# Patient Record
Sex: Female | Born: 1980 | Race: Black or African American | Hispanic: No | Marital: Single | Smoking: Current every day smoker
Health system: Southern US, Community
[De-identification: ages and names within clinical notes are randomized; demographics above are authoritative.]

## PROBLEM LIST (undated history)

## (undated) DIAGNOSIS — R413 Other amnesia: Secondary | ICD-10-CM

## (undated) DIAGNOSIS — N730 Acute parametritis and pelvic cellulitis: Secondary | ICD-10-CM

## (undated) DIAGNOSIS — A599 Trichomoniasis, unspecified: Secondary | ICD-10-CM

## (undated) DIAGNOSIS — B9689 Other specified bacterial agents as the cause of diseases classified elsewhere: Secondary | ICD-10-CM

## (undated) DIAGNOSIS — F419 Anxiety disorder, unspecified: Secondary | ICD-10-CM

## (undated) DIAGNOSIS — N760 Acute vaginitis: Secondary | ICD-10-CM

## (undated) DIAGNOSIS — F319 Bipolar disorder, unspecified: Secondary | ICD-10-CM

## (undated) DIAGNOSIS — R569 Unspecified convulsions: Secondary | ICD-10-CM

## (undated) DIAGNOSIS — E119 Type 2 diabetes mellitus without complications: Secondary | ICD-10-CM

## (undated) HISTORY — PX: WISDOM TOOTH EXTRACTION: SHX21

---
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
## 2000-10-10 ENCOUNTER — Encounter: Payer: Self-pay | Admitting: Emergency Medicine

## 2000-10-10 ENCOUNTER — Emergency Department (HOSPITAL_COMMUNITY): Admission: EM | Admit: 2000-10-10 | Discharge: 2000-10-10 | Payer: Self-pay | Admitting: Emergency Medicine

## 2001-07-01 ENCOUNTER — Inpatient Hospital Stay (HOSPITAL_COMMUNITY): Admission: AD | Admit: 2001-07-01 | Discharge: 2001-07-01 | Payer: Self-pay | Admitting: Obstetrics

## 2001-08-03 ENCOUNTER — Inpatient Hospital Stay (HOSPITAL_COMMUNITY): Admission: AD | Admit: 2001-08-03 | Discharge: 2001-08-06 | Payer: Self-pay | Admitting: Obstetrics

## 2001-10-23 ENCOUNTER — Emergency Department (HOSPITAL_COMMUNITY): Admission: EM | Admit: 2001-10-23 | Discharge: 2001-10-23 | Payer: Self-pay | Admitting: Emergency Medicine

## 2001-12-28 ENCOUNTER — Emergency Department (HOSPITAL_COMMUNITY): Admission: EM | Admit: 2001-12-28 | Discharge: 2001-12-28 | Payer: Self-pay | Admitting: Emergency Medicine

## 2002-09-23 ENCOUNTER — Inpatient Hospital Stay (HOSPITAL_COMMUNITY): Admission: AD | Admit: 2002-09-23 | Discharge: 2002-09-23 | Payer: Self-pay | Admitting: Family Medicine

## 2002-10-17 ENCOUNTER — Emergency Department (HOSPITAL_COMMUNITY): Admission: EM | Admit: 2002-10-17 | Discharge: 2002-10-17 | Payer: Self-pay | Admitting: Emergency Medicine

## 2002-10-17 ENCOUNTER — Encounter: Payer: Self-pay | Admitting: Emergency Medicine

## 2002-10-29 ENCOUNTER — Encounter: Payer: Self-pay | Admitting: *Deleted

## 2002-10-29 ENCOUNTER — Emergency Department (HOSPITAL_COMMUNITY): Admission: EM | Admit: 2002-10-29 | Discharge: 2002-10-29 | Payer: Self-pay | Admitting: *Deleted

## 2002-11-05 ENCOUNTER — Emergency Department (HOSPITAL_COMMUNITY): Admission: EM | Admit: 2002-11-05 | Discharge: 2002-11-05 | Payer: Self-pay | Admitting: Emergency Medicine

## 2002-12-09 ENCOUNTER — Emergency Department (HOSPITAL_COMMUNITY): Admission: EM | Admit: 2002-12-09 | Discharge: 2002-12-09 | Payer: Self-pay

## 2003-02-28 ENCOUNTER — Encounter: Admission: RE | Admit: 2003-02-28 | Discharge: 2003-02-28 | Payer: Self-pay | Admitting: Internal Medicine

## 2003-04-20 ENCOUNTER — Emergency Department (HOSPITAL_COMMUNITY): Admission: EM | Admit: 2003-04-20 | Discharge: 2003-04-20 | Payer: Self-pay | Admitting: Emergency Medicine

## 2003-05-11 ENCOUNTER — Inpatient Hospital Stay (HOSPITAL_COMMUNITY): Admission: AD | Admit: 2003-05-11 | Discharge: 2003-05-11 | Payer: Self-pay | Admitting: *Deleted

## 2003-06-14 ENCOUNTER — Inpatient Hospital Stay (HOSPITAL_COMMUNITY): Admission: AD | Admit: 2003-06-14 | Discharge: 2003-06-14 | Payer: Self-pay | Admitting: Obstetrics & Gynecology

## 2003-07-22 ENCOUNTER — Emergency Department (HOSPITAL_COMMUNITY): Admission: EM | Admit: 2003-07-22 | Discharge: 2003-07-23 | Payer: Self-pay | Admitting: Emergency Medicine

## 2003-08-23 ENCOUNTER — Inpatient Hospital Stay (HOSPITAL_COMMUNITY): Admission: AD | Admit: 2003-08-23 | Discharge: 2003-08-23 | Payer: Self-pay | Admitting: *Deleted

## 2003-12-15 ENCOUNTER — Inpatient Hospital Stay (HOSPITAL_COMMUNITY): Admission: AD | Admit: 2003-12-15 | Discharge: 2003-12-15 | Payer: Self-pay | Admitting: *Deleted

## 2003-12-18 ENCOUNTER — Inpatient Hospital Stay (HOSPITAL_COMMUNITY): Admission: AD | Admit: 2003-12-18 | Discharge: 2003-12-18 | Payer: Self-pay | Admitting: Gynecology

## 2004-02-24 ENCOUNTER — Inpatient Hospital Stay (HOSPITAL_COMMUNITY): Admission: AD | Admit: 2004-02-24 | Discharge: 2004-02-24 | Payer: Self-pay | Admitting: Obstetrics and Gynecology

## 2004-06-06 ENCOUNTER — Other Ambulatory Visit: Admission: RE | Admit: 2004-06-06 | Discharge: 2004-06-06 | Payer: Self-pay | Admitting: Gynecology

## 2004-09-25 ENCOUNTER — Inpatient Hospital Stay (HOSPITAL_COMMUNITY): Admission: AD | Admit: 2004-09-25 | Discharge: 2004-09-25 | Payer: Self-pay | Admitting: Gynecology

## 2004-10-03 ENCOUNTER — Inpatient Hospital Stay (HOSPITAL_COMMUNITY): Admission: AD | Admit: 2004-10-03 | Discharge: 2004-10-03 | Payer: Self-pay | Admitting: Obstetrics & Gynecology

## 2004-11-17 ENCOUNTER — Encounter: Payer: Self-pay | Admitting: Emergency Medicine

## 2004-11-17 ENCOUNTER — Inpatient Hospital Stay (HOSPITAL_COMMUNITY): Admission: AD | Admit: 2004-11-17 | Discharge: 2004-11-17 | Payer: Self-pay | Admitting: Obstetrics and Gynecology

## 2005-11-18 ENCOUNTER — Inpatient Hospital Stay (HOSPITAL_COMMUNITY): Admission: AD | Admit: 2005-11-18 | Discharge: 2005-11-18 | Payer: Self-pay | Admitting: Obstetrics

## 2005-12-31 ENCOUNTER — Inpatient Hospital Stay (HOSPITAL_COMMUNITY): Admission: AD | Admit: 2005-12-31 | Discharge: 2005-12-31 | Payer: Self-pay | Admitting: Obstetrics

## 2006-01-01 ENCOUNTER — Inpatient Hospital Stay (HOSPITAL_COMMUNITY): Admission: AD | Admit: 2006-01-01 | Discharge: 2006-01-01 | Payer: Self-pay | Admitting: Obstetrics

## 2006-01-09 ENCOUNTER — Inpatient Hospital Stay (HOSPITAL_COMMUNITY): Admission: AD | Admit: 2006-01-09 | Discharge: 2006-01-11 | Payer: Self-pay | Admitting: Obstetrics

## 2006-04-07 ENCOUNTER — Inpatient Hospital Stay (HOSPITAL_COMMUNITY): Admission: AD | Admit: 2006-04-07 | Discharge: 2006-04-07 | Payer: Self-pay | Admitting: Obstetrics

## 2006-04-10 ENCOUNTER — Emergency Department (HOSPITAL_COMMUNITY): Admission: EM | Admit: 2006-04-10 | Discharge: 2006-04-11 | Payer: Self-pay | Admitting: Emergency Medicine

## 2006-08-30 ENCOUNTER — Emergency Department (HOSPITAL_COMMUNITY): Admission: EM | Admit: 2006-08-30 | Discharge: 2006-08-30 | Payer: Self-pay | Admitting: Emergency Medicine

## 2006-12-05 ENCOUNTER — Inpatient Hospital Stay (HOSPITAL_COMMUNITY): Admission: AD | Admit: 2006-12-05 | Discharge: 2006-12-05 | Payer: Self-pay | Admitting: Obstetrics

## 2008-05-23 ENCOUNTER — Inpatient Hospital Stay (HOSPITAL_COMMUNITY): Admission: AD | Admit: 2008-05-23 | Discharge: 2008-05-23 | Payer: Self-pay | Admitting: Obstetrics & Gynecology

## 2008-07-02 ENCOUNTER — Inpatient Hospital Stay (HOSPITAL_COMMUNITY): Admission: AD | Admit: 2008-07-02 | Discharge: 2008-07-02 | Payer: Self-pay | Admitting: Obstetrics

## 2008-07-02 ENCOUNTER — Ambulatory Visit: Payer: Self-pay | Admitting: Obstetrics and Gynecology

## 2008-11-07 ENCOUNTER — Emergency Department (HOSPITAL_COMMUNITY): Admission: EM | Admit: 2008-11-07 | Discharge: 2008-11-07 | Payer: Self-pay | Admitting: Emergency Medicine

## 2009-03-07 ENCOUNTER — Emergency Department (HOSPITAL_COMMUNITY): Admission: EM | Admit: 2009-03-07 | Discharge: 2009-03-08 | Payer: Self-pay | Admitting: Emergency Medicine

## 2009-04-04 ENCOUNTER — Emergency Department (HOSPITAL_COMMUNITY): Admission: EM | Admit: 2009-04-04 | Discharge: 2009-04-04 | Payer: Self-pay | Admitting: Emergency Medicine

## 2009-12-03 ENCOUNTER — Ambulatory Visit: Payer: Self-pay | Admitting: Physician Assistant

## 2009-12-03 ENCOUNTER — Inpatient Hospital Stay (HOSPITAL_COMMUNITY): Admission: AD | Admit: 2009-12-03 | Discharge: 2009-12-03 | Payer: Self-pay | Admitting: Obstetrics & Gynecology

## 2010-04-05 ENCOUNTER — Encounter: Payer: Self-pay | Admitting: *Deleted

## 2010-04-05 ENCOUNTER — Encounter: Payer: Self-pay | Admitting: Gynecology

## 2010-04-05 ENCOUNTER — Encounter: Payer: Self-pay | Admitting: Family Medicine

## 2010-05-29 LAB — WET PREP, GENITAL

## 2010-05-29 LAB — GC/CHLAMYDIA PROBE AMP, GENITAL: Chlamydia, DNA Probe: NEGATIVE

## 2010-06-16 LAB — URINALYSIS, ROUTINE W REFLEX MICROSCOPIC
Bilirubin Urine: NEGATIVE
Hgb urine dipstick: NEGATIVE
Ketones, ur: NEGATIVE mg/dL
Urobilinogen, UA: 0.2 mg/dL (ref 0.0–1.0)
pH: 5.5 (ref 5.0–8.0)

## 2010-06-16 LAB — COMPREHENSIVE METABOLIC PANEL
ALT: 20 U/L (ref 0–35)
AST: 26 U/L (ref 0–37)
Albumin: 3.7 g/dL (ref 3.5–5.2)
Alkaline Phosphatase: 53 U/L (ref 39–117)
BUN: 8 mg/dL (ref 6–23)
CO2: 25 mEq/L (ref 19–32)
Calcium: 9.6 mg/dL (ref 8.4–10.5)
Chloride: 102 mEq/L (ref 96–112)
Creatinine, Ser: 0.71 mg/dL (ref 0.4–1.2)
GFR calc Af Amer: >60 mL/min (ref >60)
GFR calc non Af Amer: >60 mL/min (ref >60)
Glucose, Bld: 101 mg/dL — ABNORMAL HIGH (ref 70–99)
Potassium: 3.8 mEq/L (ref 3.5–5.1)
Sodium: 133 mEq/L — ABNORMAL LOW (ref 135–145)
Total Bilirubin: 0.4 mg/dL (ref 0.3–1.2)
Total Protein: 7.3 g/dL (ref 6.0–8.3)

## 2010-06-16 LAB — DIFFERENTIAL
Basophils Absolute: 0.1 10*3/uL (ref 0.0–0.1)
Basophils Relative: 1 % (ref 0–1)
Eosinophils Absolute: 0.1 10*3/uL (ref 0.0–0.7)
Eosinophils Relative: 1 % (ref 0–5)
Lymphocytes Relative: 17 % (ref 12–46)
Lymphs Abs: 2.3 10*3/uL (ref 0.7–4.0)
Monocytes Absolute: 0.5 10*3/uL (ref 0.1–1.0)
Monocytes Relative: 4 % (ref 3–12)
Neutro Abs: 10.6 10*3/uL — ABNORMAL HIGH (ref 1.7–7.7)
Neutrophils Relative %: 78 % — ABNORMAL HIGH (ref 43–77)

## 2010-06-16 LAB — CBC
HCT: 43.7 % (ref 36.0–46.0)
Hemoglobin: 14.7 g/dL (ref 12.0–15.0)
MCHC: 33.7 g/dL (ref 30.0–36.0)
MCV: 84.6 fL (ref 78.0–100.0)
Platelets: 202 K/uL (ref 150–400)
RBC: 5.16 MIL/uL — ABNORMAL HIGH (ref 3.87–5.11)
RDW: 13.4 % (ref 11.5–15.5)
WBC: 13.6 10*3/uL — ABNORMAL HIGH (ref 4.0–10.5)

## 2010-06-16 LAB — WET PREP, GENITAL
Trich, Wet Prep: NONE SEEN
Yeast Wet Prep HPF POC: NONE SEEN

## 2010-06-16 LAB — LIPASE, BLOOD: Lipase: 24 U/L (ref 11–59)

## 2010-06-16 LAB — GC/CHLAMYDIA PROBE AMP, GENITAL
Chlamydia, DNA Probe: NEGATIVE
GC Probe Amp, Genital: NEGATIVE

## 2010-06-16 LAB — PREGNANCY, URINE: Preg Test, Ur: NEGATIVE

## 2010-06-25 LAB — WET PREP, GENITAL
Trich, Wet Prep: NONE SEEN
Yeast Wet Prep HPF POC: NONE SEEN

## 2010-06-26 LAB — WET PREP, GENITAL: Trich, Wet Prep: NONE SEEN

## 2010-09-11 ENCOUNTER — Other Ambulatory Visit: Payer: Self-pay | Admitting: Neurology

## 2010-09-11 DIAGNOSIS — R413 Other amnesia: Secondary | ICD-10-CM

## 2010-09-11 DIAGNOSIS — G40209 Localization-related (focal) (partial) symptomatic epilepsy and epileptic syndromes with complex partial seizures, not intractable, without status epilepticus: Secondary | ICD-10-CM

## 2010-09-18 ENCOUNTER — Other Ambulatory Visit: Payer: Self-pay

## 2010-09-19 ENCOUNTER — Ambulatory Visit
Admission: RE | Admit: 2010-09-19 | Discharge: 2010-09-19 | Disposition: A | Discharge status: Home / Self Care | Payer: Medicaid Other | Source: Ambulatory Visit | Attending: Neurology | Admitting: Neurology

## 2010-09-19 DIAGNOSIS — R413 Other amnesia: Secondary | ICD-10-CM

## 2010-09-19 DIAGNOSIS — G40209 Localization-related (focal) (partial) symptomatic epilepsy and epileptic syndromes with complex partial seizures, not intractable, without status epilepticus: Secondary | ICD-10-CM

## 2010-12-25 LAB — URINALYSIS, ROUTINE W REFLEX MICROSCOPIC
Bilirubin Urine: NEGATIVE
Nitrite: NEGATIVE
Protein, ur: NEGATIVE
Urobilinogen, UA: 1
pH: 6

## 2010-12-25 LAB — WET PREP, GENITAL

## 2010-12-25 LAB — POCT PREGNANCY, URINE: Preg Test, Ur: NEGATIVE

## 2010-12-25 LAB — GC/CHLAMYDIA PROBE AMP, GENITAL: Chlamydia, DNA Probe: NEGATIVE

## 2010-12-25 IMAGING — CT CT ABDOMEN W/ CM
2 of 4 series · 14 of 32 positions shown, 19 images · IV contrast (water & 80 ml omni 300)
Comparison: CT abdomen and pelvis 04/11/2006.

CT ABDOMEN

CLINICAL DATA: Right lower quadrant pain.  Nausea and diarrhea.

CT ABDOMEN AND PELVIS WITH CONTRAST
TECHNIQUE: Multidetector CT imaging of the abdomen and pelvis was
performed using the standard protocol following bolus
administration of intravenous contrast.
Contrast: 80 ml Omnipaque 300

[Series 2: routine abdomen · axial · 0.71mm/px · z∈[-392,-77]mm · 7 of 85 slices shown, 12 images]
[im 11/85  soft-tissue]
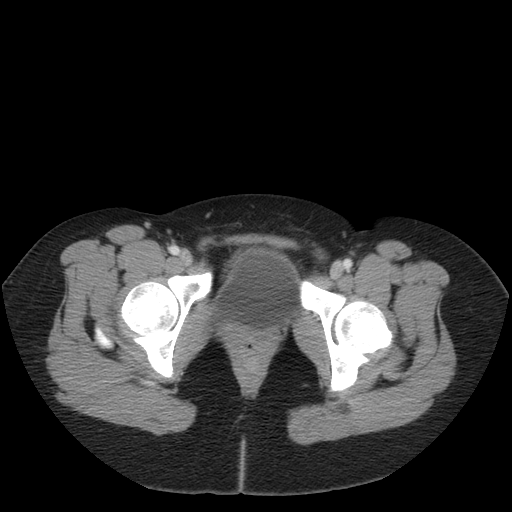
[im 11/85  bone]
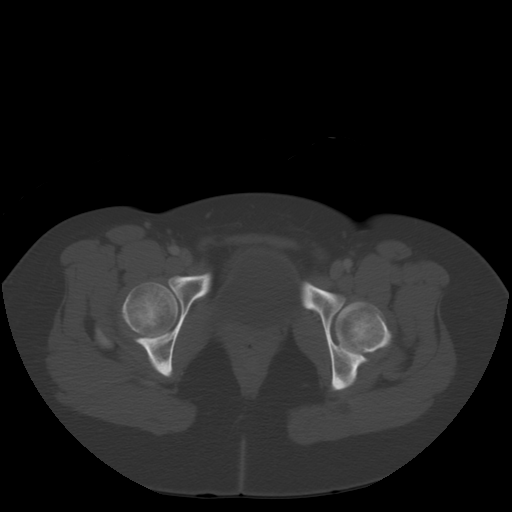
[im 22/85  soft-tissue]
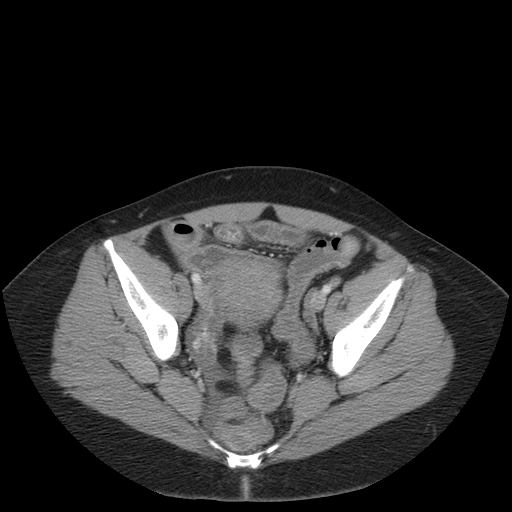
[im 32/85  soft-tissue]
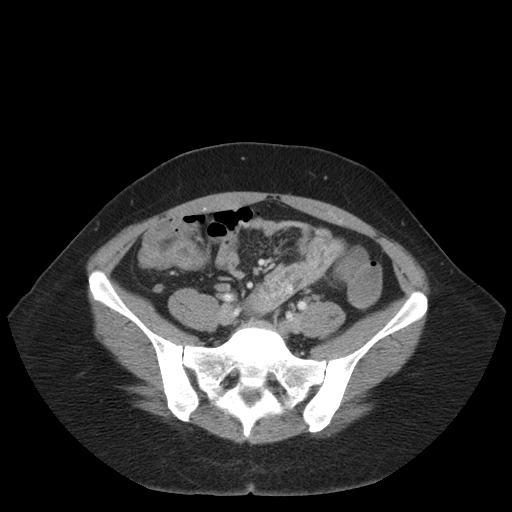
[im 43/85  soft-tissue]
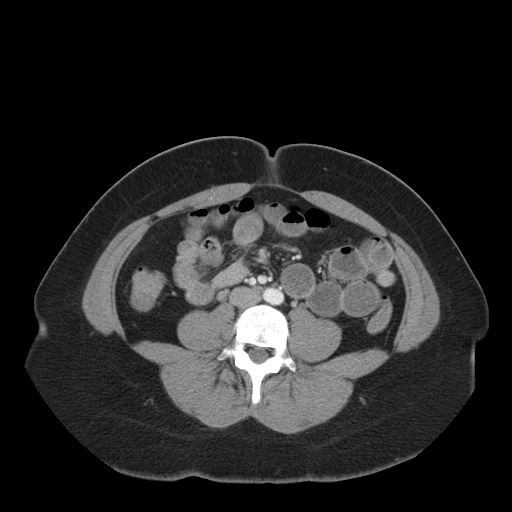
[im 43/85  lung]
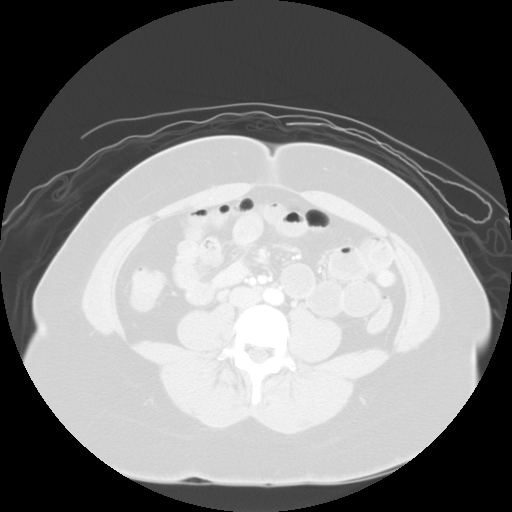
[im 53/85  soft-tissue]
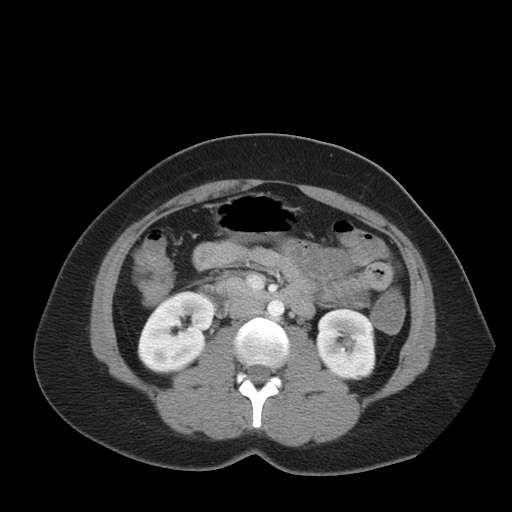
[im 53/85  lung]
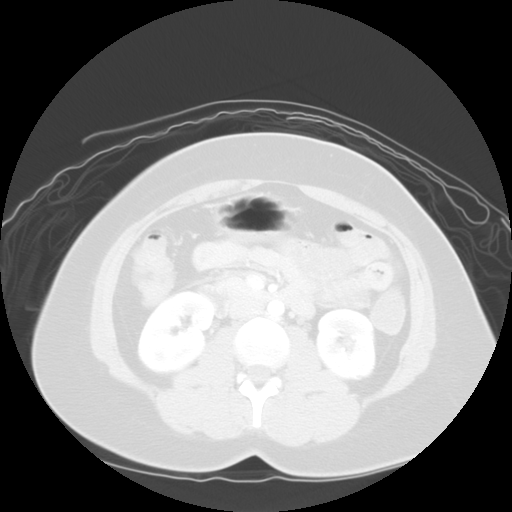
[im 64/85  soft-tissue]
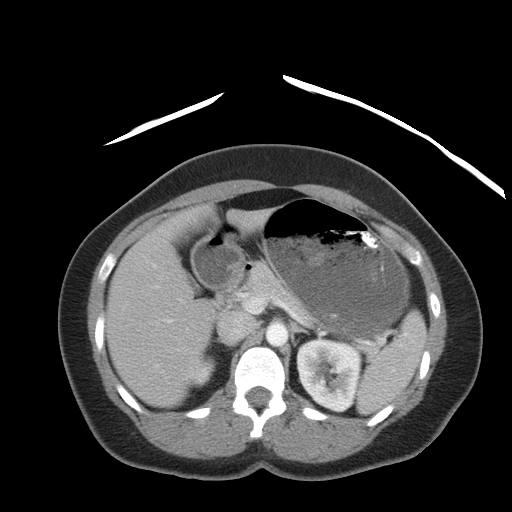
[im 64/85  lung]
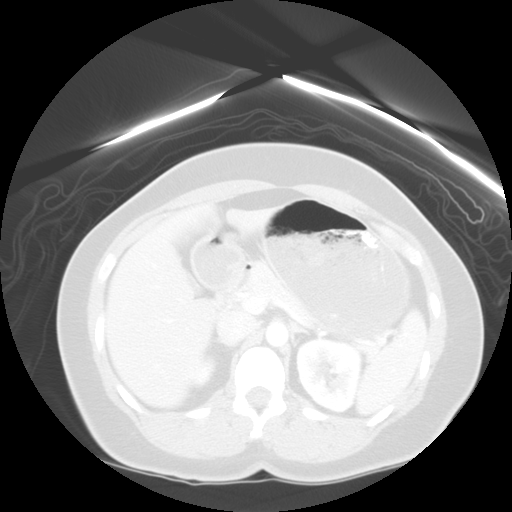
[im 74/85  soft-tissue]
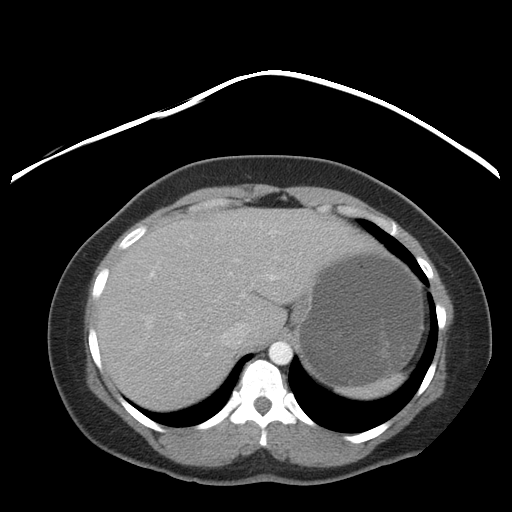
[im 74/85  lung]
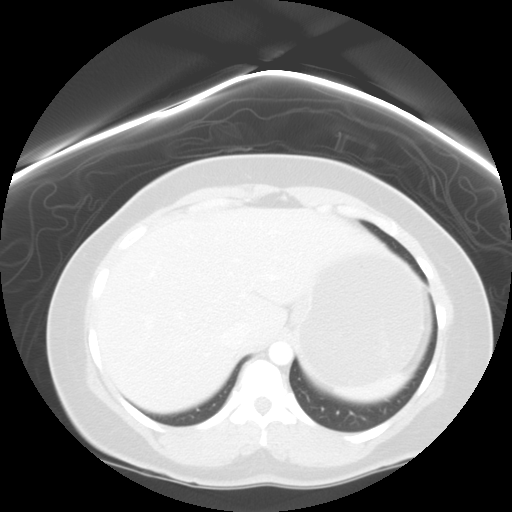

[Series 400: sag · sagittal · 0.90mm/px · 7 of 115 slices shown]
[im 11/115  soft-tissue]
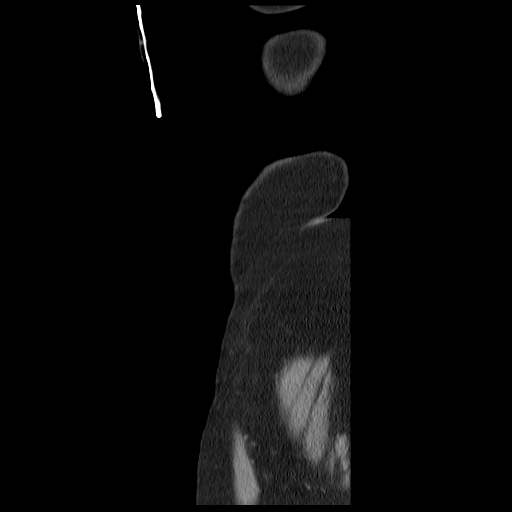
[im 21/115  soft-tissue]
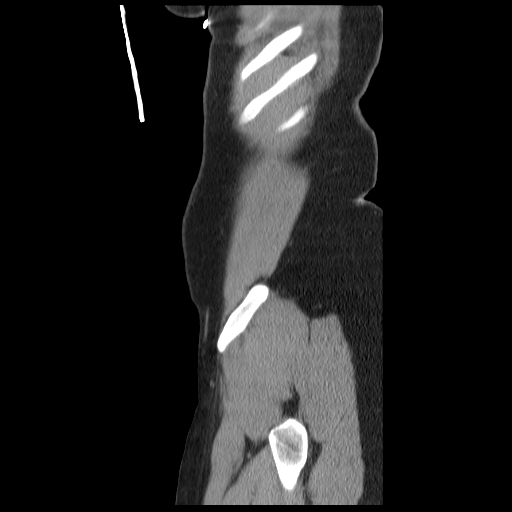
[im 42/115  soft-tissue]
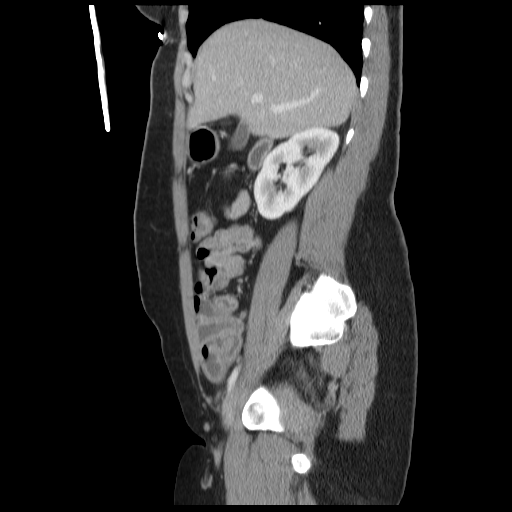
[im 52/115  soft-tissue]
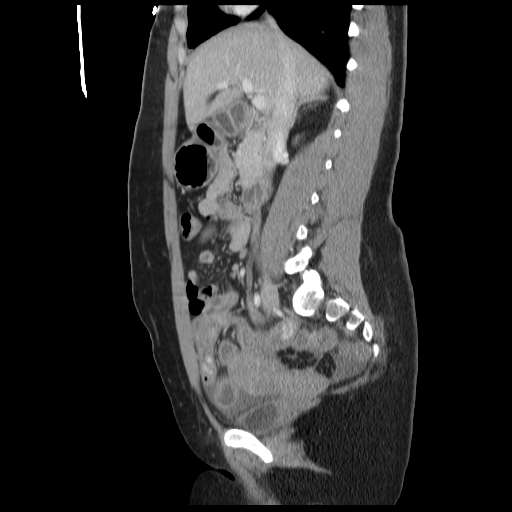
[im 63/115  soft-tissue]
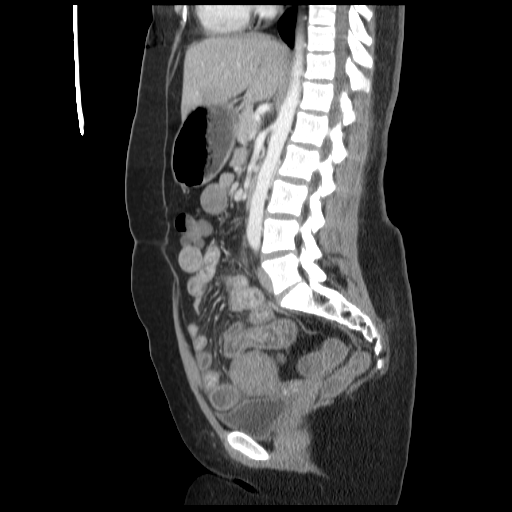
[im 73/115  soft-tissue]
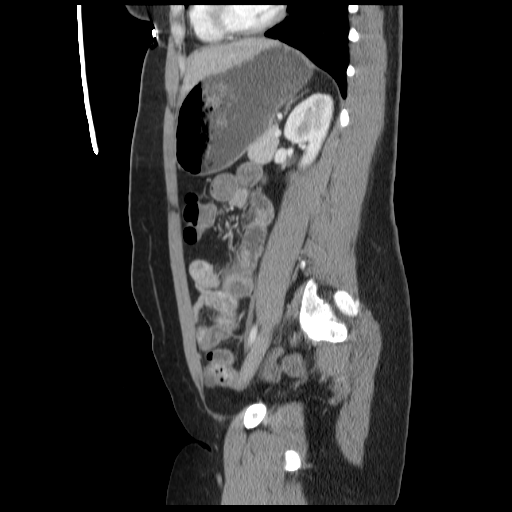
[im 94/115  soft-tissue]
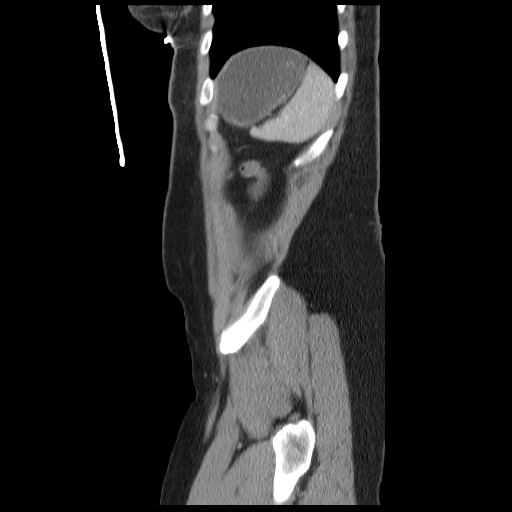

[14 of 32 positions shown; findings below may reference images not displayed]

FINDINGS: The lung bases are clear without focal nodule, mass, or
airspace disease.  The heart size is normal.  No significant
pleural or pericardial effusion is present.

The liver and spleen are within normal limits.  The stomach is
mildly dilated.  The duodenum and pancreas are within normal
limits.  High the common bile duct and gallbladder are normal.  The
adrenal glands and kidneys are within normal limits bilaterally.
There is mild dilation of loops of small bowel left upper quadrant
without focal transition point.  No significant abdominal
adenopathy or free fluid is present.
IMPRESSION: 1.  Mild dilation of loops of small bowel the left lower quadrant
without focal transition point.  This may represent a focal ileus.
2.  Otherwise unremarkable CT of the abdomen.

CT PELVIS
FINDINGS: The rectosigmoid colon is within normal limits.  The
remainder of the colon is normal.  The appendix is visualized and
normal.  The distal small bowel is unremarkable.  Uterus is normal.
Prominent vessels surrounding the adnexa bilaterally.  No discrete
cysts are evident.  Small to moderate amount of free fluid is seen
within the anatomic pelvis.  The urinary bladder is unremarkable.

Bone windows are normal.
IMPRESSION: 1.  Moderate free fluid within the pelvis.  This may be
physiologic, likely related to recent ovulation.
2.  Prominent vessels surrounding the adnexa bilaterally.  An
element of pelvic congestion syndrome is considered.

## 2011-02-24 ENCOUNTER — Encounter (HOSPITAL_COMMUNITY): Payer: Self-pay | Admitting: *Deleted

## 2011-02-24 ENCOUNTER — Inpatient Hospital Stay (HOSPITAL_COMMUNITY)
Admission: AD | Admit: 2011-02-24 | Discharge: 2011-02-24 | Disposition: A | Discharge status: Home / Self Care | Payer: Medicaid Other | Source: Ambulatory Visit | Attending: Obstetrics & Gynecology | Admitting: Obstetrics & Gynecology

## 2011-02-24 DIAGNOSIS — N949 Unspecified condition associated with female genital organs and menstrual cycle: Secondary | ICD-10-CM | POA: Insufficient documentation

## 2011-02-24 DIAGNOSIS — B9689 Other specified bacterial agents as the cause of diseases classified elsewhere: Secondary | ICD-10-CM | POA: Insufficient documentation

## 2011-02-24 DIAGNOSIS — A499 Bacterial infection, unspecified: Secondary | ICD-10-CM

## 2011-02-24 DIAGNOSIS — N760 Acute vaginitis: Secondary | ICD-10-CM

## 2011-02-24 HISTORY — DX: Anxiety disorder, unspecified: F41.9

## 2011-02-24 HISTORY — DX: Unspecified convulsions: R56.9

## 2011-02-24 LAB — URINALYSIS, ROUTINE W REFLEX MICROSCOPIC
Nitrite: NEGATIVE
Specific Gravity, Urine: 1.015 (ref 1.005–1.030)
pH: 5.5 (ref 5.0–8.0)

## 2011-02-24 LAB — WET PREP, GENITAL: Yeast Wet Prep HPF POC: NONE SEEN

## 2011-02-24 MED ORDER — CLINDAMYCIN HCL 300 MG PO CAPS: 300.0000 mg | ORAL_CAPSULE | Freq: Two times a day (BID) | ORAL | Status: AC

## 2011-02-24 NOTE — Progress Notes (Signed)
Pt reports she has had a bacterial infection for "a couple of months" , pt reports she has the same symptoms as before when she was treated for same. States she has been unable to go to the doctor and is now having lower abd cramping. LMP 02/10/2011. On birth control pills.

## 2011-02-24 NOTE — ED Provider Notes (Signed)
History     Chief Complaint  Patient presents with  . Vaginal Discharge   HPI 30 y.o. G4P4 with c/o vaginal discharge and odor x 2 months. Now having cramping. Has h/o recurrent BV.     Past Medical History  Diagnosis Date  . Seizures   . Anxiety     Past Surgical History  Procedure Date  . Wisdom tooth extraction     History reviewed. No pertinent family history.  History  Substance Use Topics  . Smoking status: Current Everyday Smoker -- 0.2 packs/day    Types: Cigars  . Smokeless tobacco: Not on file  . Alcohol Use: No    Allergies:  Allergies  Allergen Reactions  . Codeine Itching    Prescriptions prior to admission  Medication Sig Dispense Refill  . carbamazepine (TEGRETOL) 200 MG tablet Take 200 mg by mouth 2 (two) times daily.        Marland Kitchen levETIRAcetam (KEPPRA) 250 MG tablet Take 250 mg by mouth 2 (two) times daily.        . metroNIDAZOLE (METROCREAM) 0.75 % cream Apply 1 application topically daily as needed. For acne flare-ups and itchy skin       . norelgestromin-ethinyl estradiol (ORTHO EVRA) 150-20 MCG/24HR transdermal patch Place 1 patch onto the skin once a week. Pt uses for three weeks, then off for one week         Review of Systems  Constitutional: Negative.   Respiratory: Negative.   Cardiovascular: Negative.   Gastrointestinal: Negative for nausea, vomiting, abdominal pain, diarrhea and constipation.  Genitourinary: Negative for dysuria, urgency, frequency, hematuria and flank pain.       Positive for vaginal discharge and cramping  Musculoskeletal: Negative.   Neurological: Negative.   Psychiatric/Behavioral: Negative.    Physical Exam   Blood pressure 112/74, pulse 90, temperature 99.3 F (37.4 C), resp. rate 16, height 5\' 3"  (1.6 m), weight 173 lb (78.472 kg), last menstrual period 02/10/2011, SpO2 97.00%.  Physical Exam  Nursing note and vitals reviewed. Constitutional: She is oriented to person, place, and time. She appears  well-developed and well-nourished. No distress.  HENT:  Head: Normocephalic and atraumatic.  Cardiovascular: Normal rate.   Respiratory: Effort normal. No respiratory distress.  GI: Soft. She exhibits no distension and no mass. There is no tenderness. There is no rebound and no guarding.  Genitourinary: There is no rash or lesion on the right labia. There is no rash or lesion on the left labia. Uterus is not deviated, not enlarged, not fixed and not tender. Cervix exhibits no motion tenderness, no discharge and no friability. Right adnexum displays no mass, no tenderness and no fullness. Left adnexum displays no mass, no tenderness and no fullness. No erythema, tenderness or bleeding around the vagina. Vaginal discharge (white) found.  Neurological: She is alert and oriented to person, place, and time.  Skin: Skin is warm and dry.  Psychiatric: She has a normal mood and affect.    MAU Course  Procedures Results for orders placed during the hospital encounter of 02/24/11 (from the past 24 hour(s))  URINALYSIS, ROUTINE W REFLEX MICROSCOPIC     Status: Abnormal   Collection Time   02/24/11  8:30 PM      Component Value Range   Color, Urine YELLOW  YELLOW    APPearance CLEAR  CLEAR    Specific Gravity, Urine 1.015  1.005 - 1.030    pH 5.5  5.0 - 8.0    Glucose, UA NEGATIVE  NEGATIVE (mg/dL)   Hgb urine dipstick TRACE (*) NEGATIVE    Bilirubin Urine NEGATIVE  NEGATIVE    Ketones, ur NEGATIVE  NEGATIVE (mg/dL)   Protein, ur NEGATIVE  NEGATIVE (mg/dL)   Urobilinogen, UA 0.2  0.0 - 1.0 (mg/dL)   Nitrite NEGATIVE  NEGATIVE    Leukocytes, UA TRACE (*) NEGATIVE   URINE MICROSCOPIC-ADD ON     Status: Abnormal   Collection Time   02/24/11  8:30 PM      Component Value Range   Squamous Epithelial / LPF FEW (*) RARE    WBC, UA 0-2  <3 (WBC/hpf)   RBC / HPF    <3 (RBC/hpf)   Value: NO FORMED ELEMENTS SEEN ON URINE MICROSCOPIC EXAMINATION  WET PREP, GENITAL     Status: Abnormal   Collection  Time   02/24/11  9:15 PM      Component Value Range   Yeast, Wet Prep NONE SEEN  NONE SEEN    Trich, Wet Prep NONE SEEN  NONE SEEN    Clue Cells, Wet Prep MODERATE (*) NONE SEEN    WBC, Wet Prep HPF POC FEW (*) NONE SEEN      Assessment and Plan  30 y.o. G4P4 with BV Rx Clindamycin per patient request F/U with GYN as needed with recurrent symptoms  Salathiel Ferrara 02/24/2011, 9:28 PM

## 2011-02-25 LAB — GC/CHLAMYDIA PROBE AMP, GENITAL
Chlamydia, DNA Probe: NEGATIVE
GC Probe Amp, Genital: NEGATIVE

## 2011-02-25 LAB — POCT PREGNANCY, URINE: Preg Test, Ur: NEGATIVE

## 2011-07-15 ENCOUNTER — Inpatient Hospital Stay (HOSPITAL_COMMUNITY)
Admission: AD | Admit: 2011-07-15 | Discharge: 2011-07-15 | Disposition: A | Discharge status: Home / Self Care | Payer: Medicaid Other | Source: Ambulatory Visit | Attending: Obstetrics and Gynecology | Admitting: Obstetrics and Gynecology

## 2011-07-15 ENCOUNTER — Encounter (HOSPITAL_COMMUNITY): Payer: Self-pay

## 2011-07-15 DIAGNOSIS — N898 Other specified noninflammatory disorders of vagina: Secondary | ICD-10-CM | POA: Insufficient documentation

## 2011-07-15 DIAGNOSIS — M545 Low back pain, unspecified: Secondary | ICD-10-CM | POA: Insufficient documentation

## 2011-07-15 DIAGNOSIS — N949 Unspecified condition associated with female genital organs and menstrual cycle: Secondary | ICD-10-CM | POA: Insufficient documentation

## 2011-07-15 HISTORY — DX: Other specified bacterial agents as the cause of diseases classified elsewhere: B96.89

## 2011-07-15 HISTORY — DX: Trichomoniasis, unspecified: A59.9

## 2011-07-15 HISTORY — DX: Acute vaginitis: N76.0

## 2011-07-15 LAB — URINALYSIS, ROUTINE W REFLEX MICROSCOPIC
Leukocytes, UA: NEGATIVE
Nitrite: NEGATIVE
Specific Gravity, Urine: 1.015 (ref 1.005–1.030)
pH: 6 (ref 5.0–8.0)

## 2011-07-15 LAB — URINE MICROSCOPIC-ADD ON

## 2011-07-15 LAB — WET PREP, GENITAL: Trich, Wet Prep: NONE SEEN

## 2011-07-15 NOTE — MAU Note (Signed)
Pt states has thin brown vaginal discharge with odor x?2 months, was treated for trich 2 months ago. Sharp intermittent lower abd cramping also noted x2 months. Pt thinks she has bacterial infection. LMP-07/07/2011

## 2011-07-15 NOTE — MAU Provider Note (Signed)
History     CSN: [id number]  Arrival date and time: 07/15/11 1517   First Provider Initiated Contact with Patient 07/15/11 1658      Chief Complaint  Patient presents with  . Vaginal Discharge   HPI This is a 31 year old female who presents the MAU with white foul-smelling vaginal discharge that started for 5 days ago. She has had recurrent vaginal infections over the past 5 years, including multiple episodes of bacterial vaginosis and trichomonas. She also complains of lower back pain and pelvic cramping, which she also gets when she has vaginal infection.  OB History    Grav Para Term Preterm Abortions TAB SAB Ect Mult Living   4 4        4       Past Medical History  Diagnosis Date  . Seizures   . Anxiety   . Trichomonas   . BV (bacterial vaginosis)     Past Surgical History  Procedure Date  . Wisdom tooth extraction     History reviewed. No pertinent family history.  History  Substance Use Topics  . Smoking status: Current Everyday Smoker -- 0.2 packs/day    Types: Cigars  . Smokeless tobacco: Not on file  . Alcohol Use: No    Allergies:  Allergies  Allergen Reactions  . Codeine Itching    Prescriptions prior to admission  Medication Sig Dispense Refill  . carbamazepine (TEGRETOL) 200 MG tablet Take 200 mg by mouth 2 (two) times daily.        Marland Kitchen levETIRAcetam (KEPPRA) 250 MG tablet Take 250 mg by mouth 2 (two) times daily.        . metroNIDAZOLE (METROCREAM) 0.75 % cream Apply 1 application topically daily as needed. For acne flare-ups and itchy skin       . Multiple Vitamin (MULTIVITAMIN) tablet Take 1 tablet by mouth daily. One a day vitamins      . naproxen (NAPROSYN) 500 MG tablet Take 500 mg by mouth 2 (two) times daily with a meal. Head ache      . norelgestromin-ethinyl estradiol (ORTHO EVRA) 150-20 MCG/24HR transdermal patch Place 1 patch onto the skin once a week. Pt uses for three weeks, then off for one week       . vitamin C (ASCORBIC ACID) 250  MG tablet Take 250 mg by mouth daily.        Review of Systems  Constitutional: Negative for fever and chills.  Gastrointestinal: Negative for nausea, vomiting, abdominal pain, diarrhea and constipation.  All other systems reviewed and are negative.   Physical Exam   Blood pressure 105/74, pulse 94, temperature 97 F (36.1 C), temperature source Oral, resp. rate 16, height 5\' 3"  (1.6 m), weight 72.632 kg (160 lb 2 oz), last menstrual period 07/07/2011, SpO2 100.00%.  Physical Exam  Constitutional: She is oriented to person, place, and time. She appears well-developed and well-nourished.  GI: Soft. Bowel sounds are normal. She exhibits no distension and no mass. There is no tenderness. There is no rebound and no guarding.  Genitourinary:       White vaginal discharge. No cervical motion tenderness. Vaginal walls appear without lesions or  Musculoskeletal: Normal range of motion.  Neurological: She is alert and oriented to person, place, and time.  Skin: Skin is warm and dry.  Psychiatric: She has a normal mood and affect. Her behavior is normal. Judgment and thought content normal.   Results for orders placed during the hospital encounter of 07/15/11 (from the  past 24 hour(s))  URINALYSIS, ROUTINE W REFLEX MICROSCOPIC     Status: Abnormal   Collection Time   07/15/11  3:45 PM      Component Value Range   Color, Urine YELLOW  YELLOW    APPearance CLEAR  CLEAR    Specific Gravity, Urine 1.015  1.005 - 1.030    pH 6.0  5.0 - 8.0    Glucose, UA NEGATIVE  NEGATIVE (mg/dL)   Hgb urine dipstick TRACE (*) NEGATIVE    Bilirubin Urine NEGATIVE  NEGATIVE    Ketones, ur NEGATIVE  NEGATIVE (mg/dL)   Protein, ur NEGATIVE  NEGATIVE (mg/dL)   Urobilinogen, UA 0.2  0.0 - 1.0 (mg/dL)   Nitrite NEGATIVE  NEGATIVE    Leukocytes, UA NEGATIVE  NEGATIVE   URINE MICROSCOPIC-ADD ON     Status: Abnormal   Collection Time   07/15/11  3:45 PM      Component Value Range   Squamous Epithelial / LPF FEW (*)  RARE    RBC / HPF 0-2  <3 (RBC/hpf)   Bacteria, UA RARE  RARE    Casts GRANULAR CAST (*) NEGATIVE   POCT PREGNANCY, URINE     Status: Normal   Collection Time   07/15/11  3:50 PM      Component Value Range   Preg Test, Ur NEGATIVE  NEGATIVE   WET PREP, GENITAL     Status: Abnormal   Collection Time   07/15/11  4:18 PM      Component Value Range   Yeast Wet Prep HPF POC NONE SEEN  NONE SEEN    Trich, Wet Prep NONE SEEN  NONE SEEN    Clue Cells Wet Prep HPF POC NONE SEEN  NONE SEEN    WBC, Wet Prep HPF POC FEW (*) NONE SEEN      MAU Course  Procedures  MDM No evidence of UTI. No evidence of trichomonas, after vaginosis, or yeast infection. Gonorrhea and Chlamydia swab was sent for culture  Assessment and Plan  #1 vaginal discharge  Discharge patient to home. We'll call patient if positive gonorrhea or Chlamydia. Recommended the patient followup with primary provider if continues to have symptoms.   Lorriane Dehart JEHIEL 07/15/2011, 5:18 PM

## 2011-07-15 NOTE — MAU Note (Addendum)
Patient is in with c/o recurrent BV, was treated for trich about 2 months ago. She states that she an't recall when the recent discharge started. Just that its copious and odorous. She also c/o constant lower abdominal cramping. She has used vaginal creams as well

## 2011-07-15 NOTE — Discharge Instructions (Signed)
General Instructions for Vaginal Infections  Vaginitis is a term to describe many common vaginal infections. These infections may be due to an imbalance of normal germs (bacteria) that exist in the vagina. Many others are caused by sexually transmitted diseases (STD's). If any medication was prescribed to treat your specific infection, it is very important that you take the medication as directed. Your caregiver may want to examine and treat your sex partner.  CAUSES   The vagina normally contains organisms (bacteria and yeast) in a balance. Certain factors can disturb this balance and cause an infection, such as:   Sexual intercourse.   Nursing.   Pregnancy.   Menopause.   Hormone changes in the body.   Antibiotics.   Infection elsewhere in your body.   Birth control pills or patches.   Douches.   Spermicides.   Medical illnesses (diabetes).  SYMPTOMS   Different types of vaginal infections cause symptoms such as:   Itching.   Pain or burning.   Bad odor.   Pain or bleeding with sexual intercourse.   Redness of the vulva.   Abnormal discharge (yellow, green, heavy white and thick).   Fever.   A sore on the vulva or vagina.   Urinary symptoms (painful or bloody urine).   Pelvic and/or abdominal pain.   Rectal bleeding, discharge or pain.  DIAGNOSIS    Your caregiver will base the diagnosis upon the symptoms that you report.   A complete history of your sex life may be taken   You may have a pelvic exam.   A sample of your vaginal fluid and/or discharge will be examined under the microscope.   Cultures will help complete the exact diagnosis.  TREATMENT   Treatment depends on the cause of your vaginitis. Your treatment may include a medicine that kills germs (antibiotic). The antibiotic may be a shot, a pill, and/or vaginal suppository or cream. It is not uncommon for more than one type of infection to be present. If more than one infection is present, two or more medications may be  required. Reoccurrence of vaginal infections may be treated with vaginal suppositories or vaginal cream 2 times a week, or as directed.  If your caregiver finds that an STD exists, treatment of your sexual partner(s) is important. This is especially important for those infected with chlamydia, gonorrhea, trichomoniasis, bacterial vaginosis, syphilis and HIV infections. Treating sexual partners will prevent you from being re-infected and help stop the spread of STD infection to others. Although it is best to see a specialist for STD/HIV testing and counseling, this is not always possible. Some states/provinces permit something called "expedited partner therapy." This kind of program permits you to deliver prescription(s) to a partner without the partner having to seek a formal medical exam.   HOME CARE INSTRUCTIONS    Take all prescribed medication.   If applicable, speak to your partner about recommended treatment.   Do not have sexual intercourse for one week, or as directed by your caregiver.   Practice safe sex.   Use condoms.   Have only one sex partner.   Make sure your sex partner does not have any other sex partners.   Avoid tight pants and panty hose.   Wear cotton underwear.   Do not douche.   Avoid tampons, especially scented ones.   Take warm sitz baths.   Avoid vaginal sprays and perfumed soaps or bath oils.   Apply medicated cream (steroid cream) for itching or irritation with   the permission of your caregiver.  SEEK MEDICAL CARE IF:    You have any kind of abnormal vaginal discharge.   Your sex partner has a genital infection.   You have pain or bleeding with sexual intercourse.   You have itching, pain, irritation or bleeding of the vulva.  SEEK IMMEDIATE MEDICAL CARE IF:    You have an oral temperature above 102 F (38.9 C), not controlled by medicine.   You have belly (abdominal) pain.   Symptoms do not improve within 3 days or as directed.   You develop painful or bloody  urine.   You develop rectal pain, bleeding or discharge.  Document Released: 12/10/2004 Document Revised: 02/19/2011 Document Reviewed: 07/26/2008  [employer] Patient Information 2012 [employer]

## 2011-07-16 LAB — GC/CHLAMYDIA PROBE AMP, GENITAL
Chlamydia, DNA Probe: NEGATIVE
GC Probe Amp, Genital: NEGATIVE

## 2011-09-10 ENCOUNTER — Encounter (HOSPITAL_COMMUNITY): Payer: Self-pay | Admitting: *Deleted

## 2011-09-10 ENCOUNTER — Inpatient Hospital Stay (HOSPITAL_COMMUNITY)
Admission: AD | Admit: 2011-09-10 | Discharge: 2011-09-10 | Disposition: A | Discharge status: Home / Self Care | Payer: Self-pay | Source: Ambulatory Visit | Attending: Obstetrics & Gynecology | Admitting: Obstetrics & Gynecology

## 2011-09-10 DIAGNOSIS — R350 Frequency of micturition: Secondary | ICD-10-CM | POA: Insufficient documentation

## 2011-09-10 DIAGNOSIS — N390 Urinary tract infection, site not specified: Secondary | ICD-10-CM | POA: Insufficient documentation

## 2011-09-10 HISTORY — DX: Acute parametritis and pelvic cellulitis: N73.0

## 2011-09-10 HISTORY — DX: Other amnesia: R41.3

## 2011-09-10 HISTORY — DX: Bipolar disorder, unspecified: F31.9

## 2011-09-10 LAB — URINALYSIS, ROUTINE W REFLEX MICROSCOPIC
Bilirubin Urine: NEGATIVE
Nitrite: NEGATIVE
Protein, ur: NEGATIVE mg/dL
Specific Gravity, Urine: 1.02 (ref 1.005–1.030)
Urobilinogen, UA: 0.2 mg/dL (ref 0.0–1.0)

## 2011-09-10 LAB — URINE MICROSCOPIC-ADD ON

## 2011-09-10 LAB — WET PREP, GENITAL: Trich, Wet Prep: NONE SEEN

## 2011-09-10 MED ORDER — PHENAZOPYRIDINE HCL 100 MG PO TABS: 100.0000 mg | ORAL_TABLET | Freq: Three times a day (TID) | ORAL | Status: AC | PRN

## 2011-09-10 MED ORDER — CIPROFLOXACIN HCL 500 MG PO TABS: 500.0000 mg | ORAL_TABLET | Freq: Two times a day (BID) | ORAL | Status: AC

## 2011-09-10 NOTE — MAU Note (Signed)
Pt C/O burning with urination since yesterday.  Attempted to drink with Cranberry juice but urgency and burning has gotten worse.  Pt also has a vaginal discharge with Fish smell.

## 2011-09-10 NOTE — Discharge Instructions (Signed)

## 2011-09-10 NOTE — MAU Provider Note (Signed)
History     CSN: [id number]  Arrival date & time 09/10/11  0901   None     Chief Complaint  Patient presents with  . Urinary Tract Infection  . Vaginal Discharge    HPI Joyce Thomas is a 31 y.o. female who presents to MAU for UTI symptoms. Symptoms began yesterday with frequency, dysuria and  Urgency. She rates her pain as 8/10.  Denies fever or chills. Vaginal discharge that is brown with odor. LMP 08/28/11 and was normal. Last pap smear > 1 year and was normal. Last sexual intercourse 2 months ago with partner of 13 years. No longer with partner. Request testing for STI's. Hx of PID and trichomonas. The history was provided by the patient.  Past Medical History  Diagnosis Date  . Seizures   . Anxiety   . Trichomonas   . BV (bacterial vaginosis)   . PID (acute pelvic inflammatory disease)   . Short-term memory loss   . Bipolar 1 disorder     Past Surgical History  Procedure Date  . Wisdom tooth extraction     Family History  Problem Relation Age of Onset  . Other Neg Hx     History  Substance Use Topics  . Smoking status: Current Everyday Smoker -- 0.2 packs/day    Types: Cigars  . Smokeless tobacco: Not on file  . Alcohol Use: No    OB History    Grav Para Term Preterm Abortions TAB SAB Ect Mult Living   11 4 4  6   1  4       Review of Systems  Constitutional: Negative for fever, chills, diaphoresis and fatigue.  HENT: Negative for ear pain, congestion, sore throat, facial swelling, neck pain, neck stiffness, dental problem and sinus pressure.   Eyes: Negative for photophobia, pain and discharge.  Respiratory: Negative for cough, chest tightness and wheezing.   Gastrointestinal: Negative for nausea, vomiting, abdominal pain, diarrhea, constipation and abdominal distention.  Genitourinary: Negative for dysuria, frequency, flank pain and difficulty urinating.  Musculoskeletal: Negative for myalgias, back pain and gait problem.  Skin: Negative for color  change and rash.  Neurological: Negative for dizziness, speech difficulty, weakness, light-headedness, numbness and headaches.  Psychiatric/Behavioral: Negative for confusion and agitation.    Allergies  Codeine  Home Medications  No current outpatient prescriptions on file.  BP 97/58  Pulse 76  Temp 98.2 F (36.8 C) (Oral)  Resp 16  Ht 5\' 5"  (1.651 m)  Wt 155 lb 6.4 oz (70.489 kg)  BMI 25.86 kg/m2  SpO2 100%  LMP 08/28/2011  Physical Exam  Nursing note and vitals reviewed. Constitutional: She is oriented to person, place, and time. She appears well-developed and well-nourished. No distress.  HENT:  Head: Normocephalic.  Eyes: EOM are normal.  Neck: Neck supple.  Cardiovascular: Normal rate.   Pulmonary/Chest: Effort normal.  Abdominal: Soft. There is tenderness in the suprapubic area. There is no rebound, no guarding and no CVA tenderness.  Genitourinary:       External genitalia without lesions. White discharge vaginal vault. Mild CMT, no adnexal tenderness. Uterus without palpable enlargement.  Musculoskeletal: Normal range of motion.  Neurological: She is alert and oriented to person, place, and time. No cranial nerve deficit.  Skin: Skin is warm and dry.  Psychiatric: She has a normal mood and affect. Her behavior is normal. Judgment and thought content normal.   Results for orders placed during the hospital encounter of 09/10/11 (from the past 24  hour(s))  URINALYSIS, ROUTINE W REFLEX MICROSCOPIC     Status: Abnormal   Collection Time   09/10/11  9:30 AM      Component Value Range   Color, Urine YELLOW  YELLOW   APPearance CLEAR  CLEAR   Specific Gravity, Urine 1.020  1.005 - 1.030   pH 6.5  5.0 - 8.0   Glucose, UA NEGATIVE  NEGATIVE mg/dL   Hgb urine dipstick LARGE (*) NEGATIVE   Bilirubin Urine NEGATIVE  NEGATIVE   Ketones, ur NEGATIVE  NEGATIVE mg/dL   Protein, ur NEGATIVE  NEGATIVE mg/dL   Urobilinogen, UA 0.2  0.0 - 1.0 mg/dL   Nitrite NEGATIVE   NEGATIVE   Leukocytes, UA LARGE (*) NEGATIVE  URINE MICROSCOPIC-ADD ON     Status: Abnormal   Collection Time   09/10/11  9:30 AM      Component Value Range   Squamous Epithelial / LPF RARE  RARE   WBC, UA TOO NUMEROUS TO COUNT  <3 WBC/hpf   RBC / HPF 3-6  <3 RBC/hpf   Bacteria, UA FEW (*) RARE  WET PREP, GENITAL     Status: Normal   Collection Time   09/10/11 10:12 AM      Component Value Range   Yeast Wet Prep HPF POC NONE SEEN  NONE SEEN   Trich, Wet Prep NONE SEEN  NONE SEEN   Clue Cells Wet Prep HPF POC NONE SEEN  NONE SEEN   WBC, Wet Prep HPF POC NONE SEEN  NONE SEEN   Assessment: UTI  Plan:  Rx Cipro    GC, Chlamydia, Wet prep   Return as needed. ED Course  Procedures   MDM

## 2012-03-10 ENCOUNTER — Encounter (HOSPITAL_COMMUNITY): Payer: Self-pay | Admitting: *Deleted

## 2012-03-10 ENCOUNTER — Inpatient Hospital Stay (HOSPITAL_COMMUNITY)
Admission: AD | Admit: 2012-03-10 | Discharge: 2012-03-10 | Disposition: A | Discharge status: Home / Self Care | Payer: Medicaid Other | Source: Ambulatory Visit | Attending: Obstetrics & Gynecology | Admitting: Obstetrics & Gynecology

## 2012-03-10 DIAGNOSIS — A5901 Trichomonal vulvovaginitis: Secondary | ICD-10-CM | POA: Insufficient documentation

## 2012-03-10 DIAGNOSIS — R059 Cough, unspecified: Secondary | ICD-10-CM | POA: Insufficient documentation

## 2012-03-10 DIAGNOSIS — N949 Unspecified condition associated with female genital organs and menstrual cycle: Secondary | ICD-10-CM | POA: Insufficient documentation

## 2012-03-10 DIAGNOSIS — R05 Cough: Secondary | ICD-10-CM | POA: Insufficient documentation

## 2012-03-10 DIAGNOSIS — A599 Trichomoniasis, unspecified: Secondary | ICD-10-CM

## 2012-03-10 LAB — INFLUENZA PANEL BY PCR (TYPE A & B)
H1N1 flu by pcr: NOT DETECTED
Influenza A By PCR: NEGATIVE
Influenza B By PCR: NEGATIVE

## 2012-03-10 LAB — POCT PREGNANCY, URINE: Preg Test, Ur: NEGATIVE

## 2012-03-10 LAB — WET PREP, GENITAL: Yeast Wet Prep HPF POC: NONE SEEN

## 2012-03-10 MED ORDER — METRONIDAZOLE 500 MG PO TABS
2000.0000 mg | ORAL_TABLET | Freq: Once | ORAL | Status: AC
  Administered 2012-03-10: 2000 mg via ORAL
  Filled 2012-03-10: qty 4

## 2012-03-10 MED ORDER — ONDANSETRON HCL 4 MG PO TABS
8.0000 mg | ORAL_TABLET | Freq: Once | ORAL | Status: AC
  Administered 2012-03-10: 8 mg via ORAL
  Filled 2012-03-10 (×2): qty 1

## 2012-03-10 MED ORDER — ACETAMINOPHEN 325 MG PO TABS
650.0000 mg | ORAL_TABLET | Freq: Once | ORAL | Status: AC
  Administered 2012-03-10: 650 mg via ORAL
  Filled 2012-03-10: qty 2

## 2012-03-10 NOTE — MAU Provider Note (Signed)
Attestation of Attending Supervision of Advanced Practitioner (CNM/NP): Evaluation and management procedures were performed by the Advanced Practitioner under my supervision and collaboration.  I have reviewed the Advanced Practitioner's note and chart, and I agree with the management and plan.  HARRAWAY-SMITH, Awanda Wilcock 4:10 PM     

## 2012-03-10 NOTE — MAU Provider Note (Signed)
History     CSN: [id number]  Arrival date and time: 03/10/12 1332   First Provider Initiated Contact with Patient 03/10/12 1430      Chief Complaint  Patient presents with  . Cough  . Headache  . Generalized Body Aches  . Possible Pregnancy  . Vaginal Discharge   HPI  Joyce Thomas is 31 y.o. who presents today with flu like symptoms and vaginal discharge. She states that yesterday she started with a cough, and then today she had body aches and a low grade temp. She also started having a large amount of white discharge with odor and has a hx of bacterial infections.  Past Medical History  Diagnosis Date  . Seizures   . Anxiety   . Trichomonas   . BV (bacterial vaginosis)   . PID (acute pelvic inflammatory disease)   . Short-term memory loss   . Bipolar 1 disorder     Past Surgical History  Procedure Date  . Wisdom tooth extraction     Family History  Problem Relation Age of Onset  . Other Neg Hx     History  Substance Use Topics  . Smoking status: Current Every Day Smoker -- 0.2 packs/day    Types: Cigars  . Smokeless tobacco: Not on file  . Alcohol Use: No    Allergies:  Allergies  Allergen Reactions  . Codeine Itching    Prescriptions prior to admission  Medication Sig Dispense Refill  . levETIRAcetam (KEPPRA) 250 MG tablet Take 250 mg by mouth 2 (two) times daily.        . Multiple Vitamin (MULTIVITAMIN) tablet Take 1 tablet by mouth daily. One a day vitamins      . vitamin C (ASCORBIC ACID) 250 MG tablet Take 250 mg by mouth daily.        Review of Systems  Constitutional: Positive for fever and chills.  Respiratory: Positive for cough.   Genitourinary: Negative for dysuria, urgency and frequency.  Musculoskeletal: Positive for myalgias.  Neurological: Negative for headaches.   Physical Exam   Blood pressure 118/73, pulse 112, temperature 100.4 F (38 C), temperature source Oral, resp. rate 16, height 5\' 4"  (1.626 m), weight 72.848 kg  (160 lb 9.6 oz), SpO2 100.00%.  Physical Exam  Constitutional: She is oriented to person, place, and time.  Genitourinary:        External: normal Vagina: erythematous with thick yellow/green discharge Cervix: friable, no CMT Uterus: NT, NSSC Adnexa: NT  Neurological: She is alert and oriented to person, place, and time.  Skin: Skin is warm and dry.  Psychiatric: She has a normal mood and affect.    MAU Course  Procedures  Results for orders placed during the hospital encounter of 03/10/12 (from the past 24 hour(s))  POCT PREGNANCY, URINE     Status: Normal   Collection Time   03/10/12  2:20 PM      Component Value Range   Preg Test, Ur NEGATIVE  NEGATIVE  WET PREP, GENITAL     Status: Abnormal   Collection Time   03/10/12  2:33 PM      Component Value Range   Yeast Wet Prep HPF POC NONE SEEN  NONE SEEN   Trich, Wet Prep FEW (*) NONE SEEN   Clue Cells Wet Prep HPF POC NONE SEEN  NONE SEEN   WBC, Wet Prep HPF POC MANY (*) NONE SEEN    Assessment and Plan   1. Trichomonas vaginalis infection  Direct observed therapy today Discussed partner treatment, patient verbalizes understanding Flu results pending. Will DC pt home prior to results being available. Will call her if positive  Tawnya Crook 03/10/2012, 2:33 PM

## 2012-03-10 NOTE — MAU Note (Signed)
Patient states she started yesterday with a cough, headache and chest pain with body aches. States she has a vaginal discharge. Possible pregnancy.

## 2012-03-11 LAB — GC/CHLAMYDIA PROBE AMP: CT Probe RNA: NEGATIVE

## 2014-01-15 ENCOUNTER — Encounter (HOSPITAL_COMMUNITY): Payer: Self-pay | Admitting: *Deleted

## 2017-09-21 ENCOUNTER — Encounter (HOSPITAL_COMMUNITY): Payer: Self-pay | Admitting: Emergency Medicine

## 2017-09-21 ENCOUNTER — Ambulatory Visit (HOSPITAL_COMMUNITY)
Admission: EM | Admit: 2017-09-21 | Discharge: 2017-09-21 | Disposition: A | Discharge status: Home / Self Care | Payer: Self-pay | Attending: Family Medicine | Admitting: Family Medicine

## 2017-09-21 DIAGNOSIS — Z885 Allergy status to narcotic agent status: Secondary | ICD-10-CM | POA: Insufficient documentation

## 2017-09-21 DIAGNOSIS — Z794 Long term (current) use of insulin: Secondary | ICD-10-CM | POA: Insufficient documentation

## 2017-09-21 DIAGNOSIS — F17210 Nicotine dependence, cigarettes, uncomplicated: Secondary | ICD-10-CM | POA: Insufficient documentation

## 2017-09-21 DIAGNOSIS — R300 Dysuria: Secondary | ICD-10-CM | POA: Insufficient documentation

## 2017-09-21 DIAGNOSIS — Z87440 Personal history of urinary (tract) infections: Secondary | ICD-10-CM | POA: Insufficient documentation

## 2017-09-21 DIAGNOSIS — F319 Bipolar disorder, unspecified: Secondary | ICD-10-CM | POA: Insufficient documentation

## 2017-09-21 DIAGNOSIS — E119 Type 2 diabetes mellitus without complications: Secondary | ICD-10-CM | POA: Insufficient documentation

## 2017-09-21 HISTORY — DX: Type 2 diabetes mellitus without complications: E11.9

## 2017-09-21 LAB — POCT URINALYSIS DIP (DEVICE)
BILIRUBIN URINE: NEGATIVE
GLUCOSE, UA: 500 mg/dL — AB
Ketones, ur: NEGATIVE mg/dL
NITRITE: NEGATIVE
Protein, ur: NEGATIVE mg/dL
SPECIFIC GRAVITY, URINE: 1.015 (ref 1.005–1.030)
UROBILINOGEN UA: 0.2 mg/dL (ref 0.0–1.0)
pH: 5.5 (ref 5.0–8.0)

## 2017-09-21 LAB — POCT PREGNANCY, URINE: PREG TEST UR: NEGATIVE

## 2017-09-21 MED ORDER — NITROFURANTOIN MONOHYD MACRO 100 MG PO CAPS: 100.0000 mg | ORAL_CAPSULE | Freq: Two times a day (BID) | ORAL | 0 refills | Status: AC

## 2017-09-21 NOTE — ED Provider Notes (Signed)
[facility]    CSN: [id number] Arrival date & time: 09/21/17  1902     History   Chief Complaint Chief Complaint  Patient presents with  . Urinary Tract Infection    HPI Joyce Thomas is a 37 y.o. female Patient is presenting with symptoms of dysuria, increased frequency, incomplete voiding, abdominal pressure. Denies hematuria, vaginal discharge. Denies fever, nausea, vomiting.  Patient does endorse some mild lower back pain.  Patient notes that she has a history of DM type II, and has recurrent UTIs.  States that she has 1 once every other month.  She notes that she has tried cranberry pills, but recently she has not been drinking as much fluids as normal which she thinks has triggered her symptoms.   HPI  Past Medical History:  Diagnosis Date  . Anxiety   . Bipolar 1 disorder (HCC)   . BV (bacterial vaginosis)   . Diabetes mellitus without complication (HCC)   . PID (acute pelvic inflammatory disease)   . Seizures (HCC)   . Short-term memory loss   . Trichomonas     There are no active problems to display for this patient.   Past Surgical History:  Procedure Laterality Date  . WISDOM TOOTH EXTRACTION      OB History    Gravida  11   Para  4   Term  4   Preterm      AB  6   Living  4     SAB      TAB      Ectopic  1   Multiple      Live Births               Home Medications    Prior to Admission medications   Medication Sig Start Date End Date Taking? Authorizing Provider  insulin NPH Human (HUMULIN N,NOVOLIN N) 100 UNIT/ML injection Inject into the skin.   Yes [provider]  guaiFENesin (MUCINEX) 600 MG 12 hr tablet Take 600 mg by mouth daily.     [provider]  nitrofurantoin, macrocrystal-monohydrate, (MACROBID) 100 MG capsule Take 1 capsule (100 mg total) by mouth 2 (two) times daily. 09/21/17   Wieters, Junius Creamer, PA-C    Family History Family History  Problem Relation Age of Onset  . Other Neg  Hx     Social History Social History   Tobacco Use  . Smoking status: Current Every Day Smoker    Packs/day: 0.25    Types: Cigars  Substance Use Topics  . Alcohol use: No  . Drug use: No     Allergies   Codeine   Review of Systems Review of Systems  Constitutional: Negative for fever.  Respiratory: Negative for shortness of breath.   Cardiovascular: Negative for chest pain.  Gastrointestinal: Negative for abdominal pain, diarrhea, nausea and vomiting.  Genitourinary: Positive for dysuria and frequency. Negative for flank pain, genital sores, hematuria, menstrual problem, vaginal bleeding, vaginal discharge and vaginal pain.  Musculoskeletal: Positive for back pain.  Skin: Negative for rash.  Neurological: Negative for dizziness, light-headedness and headaches.     Physical Exam Triage Vital Signs ED Triage Vitals  Enc Vitals Group     BP 09/21/17 1931 128/89     Pulse Rate 09/21/17 1931 (!) 114     Resp 09/21/17 1931 18     Temp 09/21/17 1931 99 F (37.2 C)     Temp Source 09/21/17 1931 Oral  SpO2 09/21/17 1931 99 %     Weight 09/21/17 1933 152 lb (68.9 kg)     Height --      Head Circumference --      Peak Flow --      Pain Score 09/21/17 1933 4     Pain Loc --      Pain Edu? --      Excl. in GC? --    No data found.  Updated Vital Signs BP 128/89 (BP Location: Left Arm)   Pulse (!) 114   Temp 99 F (37.2 C) (Oral)   Resp 18   Wt 152 lb (68.9 kg)   SpO2 99%   BMI 26.09 kg/m  HR rechecked- 108 Visual Acuity Right Eye Distance:   Left Eye Distance:   Bilateral Distance:    Right Eye Near:   Left Eye Near:    Bilateral Near:     Physical Exam  Constitutional: She appears well-developed and well-nourished. No distress.  HENT:  Head: Normocephalic and atraumatic.  Eyes: Conjunctivae are normal.  Neck: Neck supple.  Cardiovascular: Regular rhythm.  No murmur heard. tachycardic  Pulmonary/Chest: Effort normal and breath sounds normal.  No respiratory distress.  Abdominal: Soft. There is no tenderness.  Negative CVA tenderness  Musculoskeletal: She exhibits no edema.  Tenderness to lower lumbar musculature.   Neurological: She is alert.  Skin: Skin is warm and dry.  Psychiatric: She has a normal mood and affect.  Nursing note and vitals reviewed.    UC Treatments / Results  Labs (all labs ordered are listed, but only abnormal results are displayed) Labs Reviewed  POCT URINALYSIS DIP (DEVICE) - Abnormal; Notable for the following components:      Result Value   Glucose, UA 500 (*)    Hgb urine dipstick TRACE (*)    Leukocytes, UA SMALL (*)    All other components within normal limits  URINE CULTURE  POCT PREGNANCY, URINE    EKG None  Radiology No results found.  Procedures Procedures (including critical care time)  Medications Ordered in UC Medications - No data to display  Initial Impression / Assessment and Plan / UC Course  I have reviewed the triage vital signs and the nursing notes.  Pertinent labs & imaging results that were available during my care of the patient were reviewed by me and considered in my medical decision making (see chart for details).     Small leuks on UA.  Patient is tachycardic, but without fever, tachycardia may be related to dehydration versus other stress in life-son currently in hospital.  We will go ahead and treat for UTI with Macrobid, advised patient to monitor her heart rate him to follow-up if symptoms not improving.  Discussed hydration.Discussed strict return precautions. Patient verbalized understanding and is agreeable with plan.  Final Clinical Impressions(s) / UC Diagnoses   Final diagnoses:  Dysuria     Discharge Instructions     Urine showed evidence of infection. We are treating you with macrobid. Be sure to take full course. Stay hydrated- urine should be pale yellow to clear. My continue azo for relief of burning while infection is being cleared.     Please return or follow up with your primary provider if symptoms not improving with treatment. Please return sooner if you have worsening of symptoms or develop fever, nausea, vomiting, abdominal pain, back pain, lightheadedness, dizziness.  Monitor heart rate, follow up if not resolving    ED Prescriptions  Medication Sig Dispense Auth. Provider   nitrofurantoin, macrocrystal-monohydrate, (MACROBID) 100 MG capsule Take 1 capsule (100 mg total) by mouth 2 (two) times daily. 10 capsule Wieters, Hallie C, PA-C     Controlled Substance Prescriptions [place] Controlled Substance Registry consulted? Not Applicable   Lew DawesWieters, Hallie C, [place]PA-C 09/21/17 2243

## 2017-09-21 NOTE — Discharge Instructions (Signed)
Urine showed evidence of infection. We are treating you with macrobid. Be sure to take full course. Stay hydrated- urine should be pale yellow to clear. My continue azo for relief of burning while infection is being cleared.   Please return or follow up with your primary provider if symptoms not improving with treatment. Please return sooner if you have worsening of symptoms or develop fever, nausea, vomiting, abdominal pain, back pain, lightheadedness, dizziness.  Monitor heart rate, follow up if not resolving

## 2017-09-21 NOTE — ED Triage Notes (Signed)
PT reports UTI symptoms for 3 days. PT had a UA last week which was clear per PT. PT has been using cranberry pills. Back pain as well.

## 2017-09-24 LAB — URINE CULTURE: Culture: >=100000 — AB

## 2017-09-27 ENCOUNTER — Telehealth (HOSPITAL_COMMUNITY): Payer: Self-pay

## 2017-09-27 NOTE — Telephone Encounter (Signed)
Urine culture positive for Proteus Mirabilis, resistant to Macrobid given at visit. Per patient she does not want me to call in any antibiotics for her because her insurance is not covered here. Pt states she will go see her primary care doctor when she gets home.
